# Patient Record
Sex: Male | Born: 1995 | Race: Black or African American | Hispanic: No | Marital: Single | Smoking: Never smoker
Health system: Southern US, Community
[De-identification: ages and names within clinical notes are randomized; demographics above are authoritative.]

---

## 2015-08-02 ENCOUNTER — Emergency Department (HOSPITAL_COMMUNITY)
Admission: EM | Admit: 2015-08-02 | Discharge: 2015-08-02 | Disposition: A | Discharge status: Home / Self Care | Payer: Medicaid Other | Attending: Emergency Medicine | Admitting: Emergency Medicine

## 2015-08-02 ENCOUNTER — Encounter (HOSPITAL_COMMUNITY): Payer: Self-pay | Admitting: Family Medicine

## 2015-08-02 DIAGNOSIS — R1084 Generalized abdominal pain: Secondary | ICD-10-CM | POA: Diagnosis not present

## 2015-08-02 DIAGNOSIS — R51 Headache: Secondary | ICD-10-CM | POA: Diagnosis present

## 2015-08-02 DIAGNOSIS — R42 Dizziness and giddiness: Secondary | ICD-10-CM | POA: Insufficient documentation

## 2015-08-02 DIAGNOSIS — R6889 Other general symptoms and signs: Secondary | ICD-10-CM

## 2015-08-02 DIAGNOSIS — M791 Myalgia: Secondary | ICD-10-CM | POA: Diagnosis not present

## 2015-08-02 DIAGNOSIS — R509 Fever, unspecified: Secondary | ICD-10-CM | POA: Insufficient documentation

## 2015-08-02 DIAGNOSIS — R61 Generalized hyperhidrosis: Secondary | ICD-10-CM | POA: Insufficient documentation

## 2015-08-02 LAB — COMPREHENSIVE METABOLIC PANEL
ALT: 19 U/L (ref 17–63)
AST: 22 U/L (ref 15–41)
Albumin: 4.5 g/dL (ref 3.5–5.0)
Alkaline Phosphatase: 88 U/L (ref 38–126)
Anion gap: 8 (ref 5–15)
BILIRUBIN TOTAL: 0.7 mg/dL (ref 0.3–1.2)
BUN: 10 mg/dL (ref 6–20)
CHLORIDE: 100 mmol/L — AB (ref 101–111)
CO2: 28 mmol/L (ref 22–32)
CREATININE: 1.17 mg/dL (ref 0.61–1.24)
Calcium: 9.5 mg/dL (ref 8.9–10.3)
GFR calc Af Amer: >60 mL/min (ref >60)
Glucose, Bld: 129 mg/dL — ABNORMAL HIGH (ref 65–99)
Potassium: 3.7 mmol/L (ref 3.5–5.1)
Sodium: 136 mmol/L (ref 135–145)
TOTAL PROTEIN: 8 g/dL (ref 6.5–8.1)

## 2015-08-02 LAB — CBC
HCT: 41.7 % (ref 39.0–52.0)
Hemoglobin: 14.6 g/dL (ref 13.0–17.0)
MCH: 29.3 pg (ref 26.0–34.0)
MCHC: 35 g/dL (ref 30.0–36.0)
MCV: 83.6 fL (ref 78.0–100.0)
PLATELETS: 243 10*3/uL (ref 150–400)
RBC: 4.99 MIL/uL (ref 4.22–5.81)
RDW: 12.4 % (ref 11.5–15.5)
WBC: 12.4 10*3/uL — AB (ref 4.0–10.5)

## 2015-08-02 LAB — URINE MICROSCOPIC-ADD ON
Bacteria, UA: NONE SEEN
Squamous Epithelial / LPF: NONE SEEN

## 2015-08-02 LAB — URINALYSIS, ROUTINE W REFLEX MICROSCOPIC
Bilirubin Urine: NEGATIVE
GLUCOSE, UA: NEGATIVE mg/dL
Hgb urine dipstick: NEGATIVE
KETONES UR: 15 mg/dL — AB
LEUKOCYTES UA: NEGATIVE
Nitrite: NEGATIVE
PROTEIN: 100 mg/dL — AB
Specific Gravity, Urine: 1.03 (ref 1.005–1.030)
pH: 8 (ref 5.0–8.0)

## 2015-08-02 LAB — LIPASE, BLOOD: Lipase: 20 U/L (ref 11–51)

## 2015-08-02 MED ORDER — IBUPROFEN 200 MG PO TABS
600.0000 mg | ORAL_TABLET | Freq: Once | ORAL | Status: AC
  Administered 2015-08-02: 600 mg via ORAL
  Filled 2015-08-02: qty 3

## 2015-08-02 NOTE — ED Provider Notes (Signed)
CSN: [id number]     Arrival date & time 08/02/15  0056 History   First MD Initiated Contact with Patient 08/02/15 0159     Chief Complaint  Patient presents with  . Abdominal Pain  . 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  Acute onset of myalgias, fever, headache, sore throat, runny nose, abdominal discomfort.  He did vomit one time after taking some Alka-Seltzer flu, since that time he's had no further episodes of vomiting since he has intermittent abdominal cramping.  He has not taken anything for his symptoms as he does not have anything in his house  Patient is a 19 y.o. male presenting with abdominal pain and headaches. The history is provided by the patient.  Abdominal Pain Pain location:  Generalized Pain quality: dull   Pain severity:  Mild Onset quality:  Sudden Timing:  Intermittent Chronicity:  New Relieved by:  Nothing Worsened by:  Nothing tried Associated symptoms: fever   Associated symptoms: no constipation, no cough, no diarrhea and no dysuria   Headache Pain location:  Generalized Severity currently:  7/10 Severity at highest:  4/10 Onset quality:  Sudden Timing:  Constant Progression:  Unchanged Chronicity:  New Similar to prior headaches: no   Relieved by:  None tried Worsened by:  Nothing Associated symptoms: abdominal pain, dizziness, fever and myalgias   Associated symptoms: no cough and no diarrhea     History reviewed. No pertinent past medical history. History reviewed. No pertinent past surgical history. History reviewed. No pertinent family history. Social History  Substance Use Topics  . Smoking status: Never Smoker   . Smokeless tobacco: None  . Alcohol Use: No    Review of Systems  Constitutional: Positive for fever.  Respiratory:  Negative for cough.   Gastrointestinal: Positive for abdominal pain. Negative for diarrhea and constipation.  Genitourinary: Negative for dysuria and flank pain.  Musculoskeletal: Positive for myalgias.  Neurological: Positive for dizziness and headaches.  All other systems reviewed and are negative.     Allergies  Review of patient's allergies indicates not on file.  Home Medications   Prior to Admission medications   Not on File   BP 121/72 mmHg  Pulse 100  Temp(Src) 98.7 F (37.1 C) (Oral)  Resp 20  Ht  (1.727 m)  Wt 61.236 kg  BMI 20.53 kg/m2  SpO2 98% Physical Exam  Constitutional: He is oriented to person, place, and time. He appears well-developed and well-nourished.  HENT:  Head: Normocephalic.  Mouth/Throat: Oropharynx is clear and moist.  Eyes: Pupils are equal, round, and reactive to light.  Neck: Normal range of motion.  Cardiovascular: Normal rate and regular rhythm.   Pulmonary/Chest: Effort normal and breath sounds normal.  Abdominal: Soft. He exhibits no distension. There is no tenderness.  Neurological: He is alert and oriented to person, place, and time.  Skin: Skin is warm. He is diaphoretic.  Nursing note and vitals reviewed.   ED Course  Procedures (including critical care time) Labs Review Labs Reviewed  COMPREHENSIVE METABOLIC PANEL - Abnormal; Notable for the following:    Chloride 100 (*)    Glucose, Bld 129 (*)    All other components within normal limits  CBC - Abnormal; Notable for the following:    WBC 12.4 (*)    All other components within normal limits  URINALYSIS, ROUTINE W REFLEX MICROSCOPIC (  NOT AT [facility]) - Abnormal; Notable for the following:    Color, Urine AMBER (*)    Ketones, ur 15 (*)    Protein, ur 100 (*)    All other components within normal limits  LIPASE, BLOOD  URINE MICROSCOPIC-ADD ON    Imaging Review No results found. I have personally reviewed and evaluated these images and lab results as part of my  medical decision-making.   EKG Interpretation None     increases.  Patient has influenzae.  Acute onset of all of the symptoms, approximately 18 hours.  Has not taken any medication for symptom control.  He has not tachycardic or hypotensive.  He is diaphoretic to the touch.  He will be given 800 mg ibuprofen, and influenza instructions  MDM   Final diagnoses:  Flu-like symptoms         Earley FavorGail Ataya Murdy, NP 08/02/15 [id number]  Dione Boozeavid Glick, MD 08/02/15 [id number]

## 2015-08-02 NOTE — Discharge Instructions (Signed)
Take alternating doses of tylenol/motrin for symptom relief for the next several days  Get plenty of rest  Drink plenty of fluids

## 2015-08-02 NOTE — ED Notes (Signed)
Pt reports he is experiencing is intermittent upper abd pain with nausea and vomiting. Also, complains of a diffuse headache.

## 2016-09-06 ENCOUNTER — Encounter (HOSPITAL_COMMUNITY): Payer: Self-pay | Admitting: *Deleted

## 2016-09-06 DIAGNOSIS — B009 Herpesviral infection, unspecified: Secondary | ICD-10-CM | POA: Insufficient documentation

## 2016-09-06 DIAGNOSIS — M546 Pain in thoracic spine: Secondary | ICD-10-CM | POA: Insufficient documentation

## 2016-09-06 DIAGNOSIS — Z79899 Other long term (current) drug therapy: Secondary | ICD-10-CM | POA: Insufficient documentation

## 2016-09-06 NOTE — ED Triage Notes (Signed)
The pt is c/o thinking he has herpes for 2 years  He has been tested for herpes x 2 this year.  He thinks he has an out break now.  He also wants to be seen for lower back pain

## 2016-09-07 ENCOUNTER — Emergency Department (HOSPITAL_COMMUNITY)
Admission: EM | Admit: 2016-09-07 | Discharge: 2016-09-07 | Disposition: A | Discharge status: Home / Self Care | Payer: Medicaid Other | Attending: Emergency Medicine | Admitting: Emergency Medicine

## 2016-09-07 DIAGNOSIS — A609 Anogenital herpesviral infection, unspecified: Secondary | ICD-10-CM

## 2016-09-07 DIAGNOSIS — M546 Pain in thoracic spine: Secondary | ICD-10-CM

## 2016-09-07 MED ORDER — CYCLOBENZAPRINE HCL 10 MG PO TABS: 10.0000 mg | ORAL_TABLET | Freq: Two times a day (BID) | ORAL | 0 refills | Status: AC | PRN

## 2016-09-07 MED ORDER — VALACYCLOVIR HCL 1 G PO TABS: 1000.0000 mg | ORAL_TABLET | Freq: Two times a day (BID) | ORAL | 0 refills | Status: AC

## 2016-09-07 MED ORDER — IBUPROFEN 800 MG PO TABS: 800.0000 mg | ORAL_TABLET | Freq: Three times a day (TID) | ORAL | 0 refills | Status: AC

## 2016-09-07 NOTE — ED Provider Notes (Signed)
[facility] Provider Note   CSN: [id number] Arrival date & time: 09/06/16  2233     History   Chief Complaint Chief Complaint  Patient presents with  . Back Pain  . Herpes Zoster    HPI George Manning is a 21 y.o. male with history of known genital herpes exposure who has had flareups over the past year presents with vesicles to his penis that have been present for the past 2-3 days. Patient also reports a two-week history of intermittent right-sided thoracic back pain. His pain is worse with movement and lifting at work. He denies any specific known injury, but does lift heavy objects at work. Patient has tried Tylenol and ibuprofen intermittently without relief. Patient denies any fevers, weight loss, history of cancer or recent surgery, history of IVDU, saddle anesthesia, bowel/bladder incontinence, urinary symptoms, penile pain or discharge, testicle pain or swelling, chest pain, shortness of breath, abdominal pain, nausea, vomiting.  HPI  History reviewed. No pertinent past medical history.  There are no active problems to display for this patient.   History reviewed. No pertinent surgical history.     Home Medications    Prior to Admission medications   Medication Sig Start Date End Date Taking? Authorizing Provider  cyclobenzaprine (FLEXERIL) 10 MG tablet Take 1 tablet (10 mg total) by mouth 2 (two) times daily as needed for muscle spasms. 09/07/16   Emi HolesAlexandra M Madyson Lukach, PA-C  ibuprofen (ADVIL,MOTRIN) 800 MG tablet Take 1 tablet (800 mg total) by mouth 3 (three) times daily. 09/07/16   Emi HolesAlexandra M Delonda Coley, PA-C  valACYclovir (VALTREX) 1000 MG tablet Take 1 tablet (1,000 mg total) by mouth 2 (two) times daily. 09/07/16   Emi HolesAlexandra M Rahel Carlton, PA-C    Family History No family history on file.  Social History Social History  Substance Use Topics  . Smoking status: Never Smoker  . Smokeless tobacco: Never Used  . Alcohol use No     Allergies   Patient has no known  allergies.   Review of Systems Review of Systems  Constitutional: Negative for chills and fever.  HENT: Negative for facial swelling and sore throat.   Respiratory: Negative for shortness of breath.   Cardiovascular: Negative for chest pain.  Gastrointestinal: Negative for abdominal pain, nausea and vomiting.  Genitourinary: Positive for genital sores. Negative for discharge, dysuria, frequency, penile pain, penile swelling, scrotal swelling and testicular pain.  Musculoskeletal: Positive for back pain. Negative for neck pain.  Skin: Negative for rash and wound.  Neurological: Negative for headaches.  Psychiatric/Behavioral: The patient is not nervous/anxious.      Physical Exam Updated Vital Signs BP 130/91 (BP Location: Left Arm)   Pulse 91   Temp 98.8 F (37.1 C) (Oral)   Resp 18   Ht 5\' 7"  (1.702 m)   Wt 59 kg   SpO2 100%   BMI 20.38 kg/m   Physical Exam  Constitutional: He appears well-developed and well-nourished. No distress.  HENT:  Head: Normocephalic and atraumatic.  Mouth/Throat: Oropharynx is clear and moist. No oropharyngeal exudate.  Eyes: Conjunctivae are normal. Pupils are equal, round, and reactive to light. Right eye exhibits no discharge. Left eye exhibits no discharge. No scleral icterus.  Neck: Normal range of motion. Neck supple. No thyromegaly present.  Cardiovascular: Normal rate, regular rhythm, normal heart sounds and intact distal pulses.  Exam reveals no gallop and no friction rub.   No murmur heard. Pulmonary/Chest: Effort normal and breath sounds normal. No stridor. No respiratory distress. He  has no wheezes. He has no rales.  Abdominal: Soft. Bowel sounds are normal. He exhibits no distension. There is no tenderness. There is no rebound and no guarding.  Genitourinary: Testes normal. Circumcised. No penile erythema or penile tenderness. No discharge found.  Genitourinary Comments: 2 mm vesicle to shaft of penis, nontender no drainage or other  signs of infection  Musculoskeletal: He exhibits no edema.       Thoracic back: He exhibits tenderness. He exhibits no bony tenderness.       Back:  Right thoracic paraspinal tenderness, no midline tenderness of cervical, thoracic, lumbar spine  Lymphadenopathy:    He has no cervical adenopathy.  Neurological: He is alert. Coordination normal.  Normal sensation, 5/5 strength to all 4 extremities, 2+ patellar reflexes  Skin: Skin is warm and dry. No rash noted. He is not diaphoretic. No pallor.  Psychiatric: He has a normal mood and affect.  Nursing note and vitals reviewed.    ED Treatments / Results  Labs (all labs ordered are listed, but only abnormal results are displayed) Labs Reviewed - No data to display  EKG  EKG Interpretation None       Radiology No results found.  Procedures Procedures (including critical care time)  Medications Ordered in ED Medications - No data to display   Initial Impression / Assessment and Plan / ED Course  I have reviewed the triage vital signs and the nursing notes.  Pertinent labs & imaging results that were available during my care of the patient were reviewed by me and considered in my medical decision making (see chart for details).  Clinical Course     Patient with back pain.  No neurological deficits and normal neuro exam.  Patient is ambulatory.  No loss of bowel or bladder control.  No concern for cauda equina.  No fever, night sweats, weight loss, h/o cancer, IVDA, no recent procedure to back. No urinary symptoms suggestive of UTI.  Supportive care and return precaution discussed.   Patient also with probable HSV. Will treat with Valtrex. Patient to follow-up with the health department for further treatment. Patient denies any other known exposure to STD and declines treatment or other STD testing today. Return precautions discussed. Patient understands and agrees with plan. Patient vitals stable throughout ED course and  discharged in satisfactory condition.   Final Clinical Impressions(s) / ED Diagnoses   Final diagnoses:  HSV (herpes simplex virus) anogenital infection  Acute right-sided thoracic back pain    New Prescriptions Discharge Medication List as of 09/07/2016  1:38 AM    START taking these medications   Details  cyclobenzaprine (FLEXERIL) 10 MG tablet Take 1 tablet (10 mg total) by mouth 2 (two) times daily as needed for muscle spasms., Starting Sat 09/07/2016, Print    ibuprofen (ADVIL,MOTRIN) 800 MG tablet Take 1 tablet (800 mg total) by mouth 3 (three) times daily., Starting Sat 09/07/2016, Print    valACYclovir (VALTREX) 1000 MG tablet Take 1 tablet (1,000 mg total) by mouth 2 (two) times daily., Starting Sat 09/07/2016, Print         Emi Holes, PA-C 09/07/16 0201    Jerelyn Scott, MD 09/07/16 [phone]

## 2016-09-07 NOTE — ED Notes (Signed)
ED Provider at bedside. 

## 2016-09-07 NOTE — Discharge Instructions (Signed)
Medications: Valacyclovir, Flexeril, ibuprofen  Treatment: Take Valacyclovir twice daily for 1 week. Take Flexeril twice daily as needed for muscle pain and spasms. Do not drive or operate machinery when taking Flexeril. Take ibuprofen every 8 hours for 1 week. Use ice and heat alternating 20 minutes on, 20 minutes off. Attempted the stretches 1-2 times daily as tolerated.  Follow-up: Please follow-up at the health department for further treatment of your most likely herpes infection and further STD checks. STD checks are free at the health department. Please return to emergency department if you develop any new or worsening symptoms as we discussed.

## 2017-12-19 ENCOUNTER — Ambulatory Visit (HOSPITAL_COMMUNITY)
Admission: EM | Admit: 2017-12-19 | Discharge: 2017-12-19 | Disposition: A | Discharge status: Home / Self Care | Payer: BLUE CROSS/BLUE SHIELD | Attending: Internal Medicine | Admitting: Internal Medicine

## 2017-12-19 ENCOUNTER — Encounter (HOSPITAL_COMMUNITY): Payer: Self-pay | Admitting: Emergency Medicine

## 2017-12-19 DIAGNOSIS — J019 Acute sinusitis, unspecified: Secondary | ICD-10-CM | POA: Diagnosis not present

## 2017-12-19 MED ORDER — TRIAMCINOLONE ACETONIDE 55 MCG/ACT NA AERO: 2.0000 | INHALATION_SPRAY | Freq: Every day | NASAL | 0 refills | Status: AC

## 2017-12-19 MED ORDER — AMOXICILLIN-POT CLAVULANATE 875-125 MG PO TABS: 1.0000 | ORAL_TABLET | Freq: Two times a day (BID) | ORAL | 0 refills | Status: AC

## 2017-12-19 NOTE — ED Triage Notes (Signed)
Pt states "ive been really congestion, frequent headaches, my body feels kinda weak, I have a lot of mucous in my system, and yesterday i've been coughing up a lot of mucous".

## 2017-12-19 NOTE — Discharge Instructions (Addendum)
No danger signs on exam today.  Anticipate gradual improvement in cough, head congestion, headache, over the next several days.  Cough may take a couple weeks to subside.  Prescriptions for amoxicillin/clavulanate (antibiotic) and triamcinolone nasal steroid spray (for congestion) were sent to the pharmacy.  Push fluids and rest.  Note for work today/tomorrow.  Recheck for new fever >100.5, increasing phlegm production/nasal discharge, or if not starting to improve in a few days.

## 2017-12-19 NOTE — ED Provider Notes (Signed)
[facility]    CSN: [id number] Arrival date & time: 12/19/17  1543     History   Chief Complaint Chief Complaint  Patient presents with  . URI    HPI George Manning is a 22 y.o. male.   He presents today with a couple weeks history of head congestion, runny nose, headaches.  The last 24 hours, he has had increased headache, chills, and now some cough productive of phlegm.  Fatigue, malaise.  Poor appetite, but no nausea or vomiting.  No diarrhea.  Little bit achy.  Had some sore throat initially, now resolved.    HPI  History reviewed. No pertinent past medical history.  History reviewed. No pertinent surgical history.     Home Medications    Prior to Admission medications   Medication Sig Start Date End Date Taking? Authorizing Provider  amoxicillin-clavulanate (AUGMENTIN) 875-125 MG tablet Take 1 tablet by mouth 2 (two) times daily for 7 days. 12/19/17 12/26/17  Isa RankinMurray, Tarquin Welcher Wilson, MD  cyclobenzaprine (FLEXERIL) 10 MG tablet Take 1 tablet (10 mg total) by mouth 2 (two) times daily as needed for muscle spasms. 09/07/16   Law, Waylan BogaAlexandra M, PA-C  ibuprofen (ADVIL,MOTRIN) 800 MG tablet Take 1 tablet (800 mg total) by mouth 3 (three) times daily. 09/07/16   Law, Waylan BogaAlexandra M, PA-C  triamcinolone (NASACORT) 55 MCG/ACT AERO nasal inhaler Place 2 sprays into the nose daily. 12/19/17   Isa RankinMurray, Kellin Fifer Wilson, MD  valACYclovir (VALTREX) 1000 MG tablet Take 1 tablet (1,000 mg total) by mouth 2 (two) times daily. 09/07/16   Emi HolesLaw, Alexandra M, PA-C    Family History Family History  Problem Relation Age of Onset  . Hypertension Father     Social History Social History   Tobacco Use  . Smoking status: Never Smoker  . Smokeless tobacco: Never Used  Substance Use Topics  . Alcohol use: No  . Drug use: No     Allergies   Patient has no known allergies.   Review of Systems Review of Systems  All other systems reviewed and are negative.    Physical Exam Triage  Vital Signs ED Triage Vitals  Enc Vitals Group     BP 12/19/17 1652 110/65     Pulse Rate 12/19/17 1652 82     Resp 12/19/17 1652 18     Temp 12/19/17 1652 98.1 F (36.7 C)     Temp src --      SpO2 12/19/17 1652 100 %     Weight --      Height --      Pain Score 12/19/17 1756 0     Pain Loc --    Updated Vital Signs BP 110/65   Pulse 82   Temp 98.1 F (36.7 C)   Resp 18   SpO2 100%   Physical Exam  Constitutional: He is oriented to person, place, and time. No distress.  Alert, nicely groomed Looks ill but not toxic  HENT:  Head: Atraumatic.  Bilateral TMs are moderately dull, no erythema Moderate nasal congestion bilaterally with copious mucopurulent material present Throat is somewhat red  Eyes:  Conjugate gaze, no eye redness/drainage  Neck: Neck supple.  Cardiovascular: Normal rate and regular rhythm.  Pulmonary/Chest: No respiratory distress. He has no wheezes. He has no rales.  Lungs clear, symmetric breath sounds  Abdominal: He exhibits no distension.  Musculoskeletal: Normal range of motion.  Neurological: He is alert and oriented to person, place, and time.  Skin: Skin is  warm and dry.  No cyanosis  Nursing note and vitals reviewed.    UC Treatments / Results  Labs (all labs ordered are listed, but only abnormal results are displayed) Labs Reviewed - No data to display  EKG None Radiology No results found.  Procedures Procedures (including critical care time) None today  Final Clinical Impressions(s) / UC Diagnoses   Final diagnoses:  Acute sinusitis with symptoms > 10 days   No danger signs on exam today.  Anticipate gradual improvement in cough, head congestion, headache, over the next several days.  Cough may take a couple weeks to subside.  Prescriptions for amoxicillin/clavulanate (antibiotic) and triamcinolone nasal steroid spray (for congestion) were sent to the pharmacy.  Push fluids and rest.  Note for work today/tomorrow.  Recheck  for new fever >100.5, increasing phlegm production/nasal discharge, or if not starting to improve in a few days.     ED Discharge Orders        Ordered    amoxicillin-clavulanate (AUGMENTIN) 875-125 MG tablet  2 times daily     12/19/17 1752    triamcinolone (NASACORT) 55 MCG/ACT AERO nasal inhaler  Daily     12/19/17 1752        Isa Rankin, MD 12/22/17 2105

## 2019-06-24 ENCOUNTER — Encounter (HOSPITAL_COMMUNITY): Payer: Self-pay | Admitting: Emergency Medicine

## 2019-06-24 ENCOUNTER — Emergency Department (HOSPITAL_COMMUNITY): Payer: 59

## 2019-06-24 ENCOUNTER — Emergency Department (HOSPITAL_COMMUNITY)
Admission: EM | Admit: 2019-06-24 | Discharge: 2019-06-24 | Disposition: A | Discharge status: Home / Self Care | Payer: 59 | Attending: Emergency Medicine | Admitting: Emergency Medicine

## 2019-06-24 ENCOUNTER — Other Ambulatory Visit: Payer: Self-pay

## 2019-06-24 DIAGNOSIS — Y929 Unspecified place or not applicable: Secondary | ICD-10-CM | POA: Insufficient documentation

## 2019-06-24 DIAGNOSIS — S93601A Unspecified sprain of right foot, initial encounter: Secondary | ICD-10-CM

## 2019-06-24 DIAGNOSIS — S99921A Unspecified injury of right foot, initial encounter: Secondary | ICD-10-CM | POA: Diagnosis present

## 2019-06-24 DIAGNOSIS — Y9367 Activity, basketball: Secondary | ICD-10-CM | POA: Diagnosis not present

## 2019-06-24 DIAGNOSIS — Z79899 Other long term (current) drug therapy: Secondary | ICD-10-CM | POA: Insufficient documentation

## 2019-06-24 DIAGNOSIS — X509XXA Other and unspecified overexertion or strenuous movements or postures, initial encounter: Secondary | ICD-10-CM | POA: Diagnosis not present

## 2019-06-24 DIAGNOSIS — Y999 Unspecified external cause status: Secondary | ICD-10-CM | POA: Insufficient documentation

## 2019-06-24 NOTE — ED Triage Notes (Signed)
Pt was playing basketball on Tuesday and when landed rolled his ankle. Pt having right ankle pains since. Has brace currently on ankle at this time.

## 2019-06-24 NOTE — ED Provider Notes (Signed)
[facility]-EMERGENCY DEPT Provider Note   CSN: [id number] Arrival date & time: 06/24/19  1145     History   Chief Complaint Chief Complaint  Patient presents with  . Ankle Pain    HPI George Manning is a 23 y.o. male.     22yo male presents with right ankle/foot pain. Patient states he rolled his ankle while playing basketball Tuesday night. Patient has been wearing an ankle support without relief. Pain is worse with bearing weight. No significant ankle injuries previously. No other injuries or concerns.      History reviewed. No pertinent past medical history.  There are no active problems to display for this patient.   History reviewed. No pertinent surgical history.      Home Medications    Prior to Admission medications   Medication Sig Start Date End Date Taking? Authorizing Provider  cyclobenzaprine (FLEXERIL) 10 MG tablet Take 1 tablet (10 mg total) by mouth 2 (two) times daily as needed for muscle spasms. 09/07/16   Law, Waylan Boga, PA-C  ibuprofen (ADVIL,MOTRIN) 800 MG tablet Take 1 tablet (800 mg total) by mouth 3 (three) times daily. 09/07/16   Law, Waylan Boga, PA-C  triamcinolone (NASACORT) 55 MCG/ACT AERO nasal inhaler Place 2 sprays into the nose daily. 12/19/17   George Rankin, MD  valACYclovir (VALTREX) 1000 MG tablet Take 1 tablet (1,000 mg total) by mouth 2 (two) times daily. 09/07/16   George Holes, PA-C    Family History Family History  Problem Relation Age of Onset  . Hypertension Father     Social History Social History   Tobacco Use  . Smoking status: Never Smoker  . Smokeless tobacco: Never Used  Substance Use Topics  . Alcohol use: No  . Drug use: No     Allergies   Patient has no known allergies.   Review of Systems Review of Systems  Constitutional: Negative for fever.  Musculoskeletal: Positive for arthralgias, gait problem and myalgias. Negative for joint swelling.  Skin: Negative for color  change, rash and wound.  Allergic/Immunologic: Negative for immunocompromised state.  Neurological: Negative for weakness and numbness.  Hematological: Does not bruise/bleed easily.  Psychiatric/Behavioral: Negative for confusion.  All other systems reviewed and are negative.    Physical Exam Updated Vital Signs BP 135/72 (BP Location: Right Arm)   Pulse 70   Temp 98.4 F (36.9 C) (Oral)   Resp 17   Ht 5\' 8"  (1.727 m)   Wt 62.6 kg   SpO2 100%   BMI 20.98 kg/m   Physical Exam Vitals signs and nursing note reviewed.  Constitutional:      General: He is not in acute distress.    Appearance: He is well-developed. He is not diaphoretic.  HENT:     Head: Normocephalic and atraumatic.  Cardiovascular:     Pulses: Normal pulses.  Pulmonary:     Effort: Pulmonary effort is normal.  Musculoskeletal: Normal range of motion.        General: Tenderness present. No swelling, deformity or signs of injury.     Right ankle: He exhibits normal range of motion, no swelling, no ecchymosis, no deformity, no laceration and normal pulse. No lateral malleolus, no medial malleolus, no head of 5th metatarsal and no proximal fibula tenderness found.     Right foot: Normal range of motion. Bony tenderness present. No crepitus or deformity.       Feet:  Skin:    General: Skin is warm  and dry.     Findings: No erythema or rash.  Neurological:     Mental Status: He is alert and oriented to person, place, and time.  Psychiatric:        Behavior: Behavior normal.      ED Treatments / Results  Labs (all labs ordered are listed, but only abnormal results are displayed) Labs Reviewed - No data to display  EKG None  Radiology Dg Ankle Complete Right  Result Date: 06/24/2019 CLINICAL DATA:  Right ankle pain after basketball injury. EXAM: RIGHT ANKLE - COMPLETE 3+ VIEW COMPARISON:  None. FINDINGS: There is no evidence of fracture, dislocation, or joint effusion. There is no evidence of  arthropathy or other focal bone abnormality. Soft tissues are unremarkable. IMPRESSION: Negative. Electronically Signed   By: George Manning M.D.   On: 06/24/2019 13:08   Dg Foot Complete Right  Result Date: 06/24/2019 CLINICAL DATA:  Pain following rolling injury EXAM: RIGHT FOOT COMPLETE - 3+ VIEW COMPARISON:  None. FINDINGS: Frontal, oblique, and lateral views obtained. No fracture or dislocation. Joint spaces appear normal. There is pes planus. There is an accessory ossicle lateral to the cuboid. IMPRESSION: No fracture or dislocation. No evident arthropathy. There is pes planus. Electronically Signed   By: George Manning III M.D.   On: 06/24/2019 14:15    Procedures Procedures (including critical care time)  Medications Ordered in ED Medications - No data to display   Initial Impression / Assessment and Plan / ED Course  I have reviewed the triage vital signs and the nursing notes.  Pertinent labs & imaging results that were available during my care of the patient were reviewed by me and considered in my medical decision making (see chart for details).  Clinical Course as of Jun 23 1426  Thu Jun 24, 9083  478 23 year old male presents with complaint of right foot pain after rolling his foot while playing basketball 2 days ago.  Patient is wearing an ankle support without relief.  On examination of pain at the proximal fourth metatarsal.  X-ray of the ankles unremarkable.  X-ray foot is unremarkable.  Patient states he is unable to bear weight without pain in his foot, given crutches to weight-bear as tolerated and follow-up with orthopedics if not improving.   [LM]    Clinical Course User Index [LM] Tacy Learn, PA-C      Final Clinical Impressions(s) / ED Diagnoses   Final diagnoses:  Sprain of right foot, initial encounter    ED Discharge Orders    None       Tacy Learn, PA-C 06/24/19 1427    Milton Ferguson, MD 06/27/19 1818

## 2019-06-24 NOTE — Discharge Instructions (Signed)
Take Motrin and Tylenol as needed as directed. Apply ice to foot, elevate for 30 minutes at a time. Follow up with orthopedics if not improving.

## 2020-10-25 ENCOUNTER — Other Ambulatory Visit: Payer: Self-pay

## 2020-10-25 ENCOUNTER — Emergency Department (HOSPITAL_COMMUNITY): Payer: 59

## 2020-10-25 ENCOUNTER — Emergency Department (HOSPITAL_COMMUNITY)
Admission: EM | Admit: 2020-10-25 | Discharge: 2020-10-25 | Disposition: A | Discharge status: Home / Self Care | Payer: 59 | Attending: Emergency Medicine | Admitting: Emergency Medicine

## 2020-10-25 ENCOUNTER — Encounter (HOSPITAL_COMMUNITY): Payer: Self-pay | Admitting: Emergency Medicine

## 2020-10-25 DIAGNOSIS — N50812 Left testicular pain: Secondary | ICD-10-CM | POA: Insufficient documentation

## 2020-10-25 DIAGNOSIS — N5089 Other specified disorders of the male genital organs: Secondary | ICD-10-CM | POA: Insufficient documentation

## 2020-10-25 DIAGNOSIS — R350 Frequency of micturition: Secondary | ICD-10-CM | POA: Insufficient documentation

## 2020-10-25 DIAGNOSIS — R1031 Right lower quadrant pain: Secondary | ICD-10-CM | POA: Diagnosis not present

## 2020-10-25 DIAGNOSIS — R369 Urethral discharge, unspecified: Secondary | ICD-10-CM | POA: Diagnosis not present

## 2020-10-25 LAB — URINALYSIS, ROUTINE W REFLEX MICROSCOPIC
Bilirubin Urine: NEGATIVE
Glucose, UA: NEGATIVE mg/dL
Hgb urine dipstick: NEGATIVE
Ketones, ur: NEGATIVE mg/dL
Leukocytes,Ua: NEGATIVE
Nitrite: NEGATIVE
Protein, ur: NEGATIVE mg/dL
Specific Gravity, Urine: 1.025 (ref 1.005–1.030)
pH: 7 (ref 5.0–8.0)

## 2020-10-25 MED ORDER — LIDOCAINE HCL (PF) 1 % IJ SOLN
INTRAMUSCULAR | Status: AC
  Administered 2020-10-25: 30 mL
  Filled 2020-10-25: qty 30

## 2020-10-25 MED ORDER — CEFTRIAXONE SODIUM 250 MG IJ SOLR
250.0000 mg | Freq: Once | INTRAMUSCULAR | Status: DC
  Filled 2020-10-25: qty 250

## 2020-10-25 MED ORDER — CEFTRIAXONE SODIUM 1 G IJ SOLR
500.0000 mg | Freq: Once | INTRAMUSCULAR | Status: AC
  Administered 2020-10-25: 500 mg via INTRAMUSCULAR
  Filled 2020-10-25: qty 10

## 2020-10-25 MED ORDER — DOXYCYCLINE HYCLATE 100 MG PO CAPS: 100.0000 mg | ORAL_CAPSULE | Freq: Two times a day (BID) | ORAL | 0 refills | Status: AC

## 2020-10-25 NOTE — ED Triage Notes (Signed)
Patient presents with complaints of frequency, abdominal pain, testicle pain and swelling. Complaints started this past Monday while the patient was at work. While at work he noticed testicle pain and swelling. He believed ill fitting underwear was the source of the discomfort and did notice some relief after adjusting them. Yesterday the patient went without underwear which continued to provide some relief. While testicle pain was decreasing slightly yesterday, the patient now noticed urinary frequency, abdominal pain and some discharge. Today the patient woke with increased testicle pain which makes walking and standing difficult and well as a continuation of urinary symptoms.

## 2020-10-25 NOTE — Discharge Instructions (Addendum)
Your urine did not show any signs of infection.  We treated you today prophylactically for gonorrhea.    We will send a prescription for antibiotics in order to treat you for chlamydia.  You will need to take 1 tablet twice a day for the next 10 days.  If you experience any worsening symptoms please return to the emergency department.

## 2020-10-25 NOTE — ED Provider Notes (Signed)
[facility]-EMERGENCY DEPT Provider Note   CSN: [id number] Arrival date & time: 10/25/20  1138     History Chief Complaint  Patient presents with   Abdominal Pain   Testicle Pain   Urinary Frequency    George Manning is a 25 y.o. male.  25 y.o male with no PMH presents to the ED with a chief complaint of testicular swelling x3 days.  Patient reports noting pain along the left testicle, then noticed this to be swollen.  Symptoms are exacerbated with ambulation along with wearing briefs.  They are alleviated with sitting, relieving pressure off his testicle. He is also concerned as left testicle appears larger than right. He is sexually active with last encounter about a month ago, with condom use. He does endorses penile discharge, describing this as a thick clear drainage. In addition, he reports lower abdominal pain diffuse without focal point of tenderness. No fever, no nausea, no vomiting, or other complaints.   The history is provided by the patient.  Abdominal Pain Pain location:  LLQ and RLQ Pain quality: fullness   Pain radiates to:  Does not radiate Associated symptoms: no diarrhea, no fever, no nausea, no shortness of breath, no sore throat and no vomiting   Testicle Pain This is a new problem. The current episode started more than 2 days ago. The problem occurs constantly. The problem has been gradually worsening. Associated symptoms include abdominal pain. Pertinent negatives include no headaches and no shortness of breath. The symptoms are aggravated by standing. The symptoms are relieved by rest. He has tried nothing for the symptoms.  Urinary Frequency Associated symptoms include abdominal pain. Pertinent negatives include no headaches and no shortness of breath.       History reviewed. No pertinent past medical history.  There are no problems to display for this patient.   History reviewed. No pertinent surgical history.     Family  History  Problem Relation Age of Onset   Hypertension Father     Social History   Tobacco Use   Smoking status: Never Smoker   Smokeless tobacco: Never Used  Substance Use Topics   Alcohol use: No   Drug use: No    Home Medications Prior to Admission medications   Medication Sig Start Date End Date Taking? Authorizing Provider  doxycycline (VIBRAMYCIN) 100 MG capsule Take 1 capsule (100 mg total) by mouth 2 (two) times daily for 10 days. 10/25/20 11/04/20 Yes Charmian Forbis, Leonie Douglas, PA-C  cyclobenzaprine (FLEXERIL) 10 MG tablet Take 1 tablet (10 mg total) by mouth 2 (two) times daily as needed for muscle spasms. 09/07/16   Law, Waylan Boga, PA-C  ibuprofen (ADVIL,MOTRIN) 800 MG tablet Take 1 tablet (800 mg total) by mouth 3 (three) times daily. 09/07/16   Law, Waylan Boga, PA-C  triamcinolone (NASACORT) 55 MCG/ACT AERO nasal inhaler Place 2 sprays into the nose daily. 12/19/17   Isa Rankin, MD  valACYclovir (VALTREX) 1000 MG tablet Take 1 tablet (1,000 mg total) by mouth 2 (two) times daily. 09/07/16   Emi Holes, PA-C    Allergies    Patient has no known allergies.  Review of Systems   Review of Systems  Constitutional: Negative for fever.  HENT: Negative for sore throat.   Respiratory: Negative for shortness of breath.   Gastrointestinal: Positive for abdominal pain. Negative for diarrhea, nausea and vomiting.  Genitourinary: Positive for frequency, penile discharge, scrotal swelling and testicular pain.  Neurological: Negative for light-headedness and headaches.  All other systems reviewed and are negative.   Physical Exam Updated Vital Signs BP 129/64    Pulse 70    Temp 98 F (36.7 C) (Oral)    Resp (!) 22    Ht 5\' 7"  (1.702 m)    Wt 63.5 kg    SpO2 98%    BMI 21.93 kg/m   Physical Exam Vitals and nursing note reviewed. Exam conducted with a chaperone present.  Constitutional:      Appearance: He is well-developed.  HENT:     Head: Normocephalic and atraumatic.   Cardiovascular:     Rate and Rhythm: Normal rate.  Pulmonary:     Effort: Pulmonary effort is normal.     Breath sounds: No wheezing or rales.  Abdominal:     General: Abdomen is flat. Bowel sounds are normal.  Genitourinary:    Penis: Discharge present.      Testes:        Right: Tenderness or swelling not present.        Left: Tenderness and swelling present.     Comments: Left testicle > right testicle , no visible erythema . No rash, no obvious discharge on my exam. Skin:    General: Skin is warm and dry.  Neurological:     Mental Status: He is alert and oriented to person, place, and time.     ED Results / Procedures / Treatments   Labs (all labs ordered are listed, but only abnormal results are displayed) Labs Reviewed  URINALYSIS, ROUTINE W REFLEX MICROSCOPIC  GC/CHLAMYDIA PROBE AMP ([place]) NOT AT [facility]    EKG None  Radiology [facility] SCROTUM W/DOPPLER  Result Date: 10/25/2020 CLINICAL DATA:  Pain EXAM: SCROTAL ULTRASOUND DOPPLER ULTRASOUND OF THE TESTICLES TECHNIQUE: Complete ultrasound examination of the testicles, epididymis, and other scrotal structures was performed. Color and spectral Doppler ultrasound were also utilized to evaluate blood flow to the testicles. COMPARISON:  None. FINDINGS: Right testicle Measurements: 5.3 x 3.7 x 2.2 cm. No mass or microlithiasis visualized. Left testicle Measurements: 5.0 x 3.7 x 2.8 cm. No mass or microlithiasis visualized. Right epididymis:  Normal in size and appearance. Left epididymis:  Normal in size and appearance. Hydrocele:  None visualized. Varicocele:  None visualized. Pulsed Doppler interrogation of both testes demonstrates normal low resistance arterial and venous waveforms bilaterally. IMPRESSION: Normal study. No evidence of testicular mass or torsion. Electronically Signed   By: 10/27/2020 MD   On: 10/25/2020 14:01    Procedures Procedures   Medications Ordered in ED Medications  cefTRIAXone (ROCEPHIN)  injection 500 mg (has no administration in time range)    ED Course  I have reviewed the triage vital signs and the nursing notes.  Pertinent labs & imaging results that were available during my care of the patient were reviewed by me and considered in my medical decision making (see chart for details).    MDM Rules/Calculators/A&P     Patient presents to the ED today with complaints of testicular swelling along with testicular pain, left greater than right.  The symptoms began 3 days ago, does report some penile discharge which has worsened throughout the day.  There is also pain to the left testicular region, he does notice this is more swollen than his right.  Exacerbated with movement alleviated with rest.  Has not taken any medications for improvement in his symptoms.  Vitals are within normal limits, he is afebrile.  He is currently sexually active, last encounter within the  last month.  During evaluation left testicle appears enlarged, there is noticeable pain with palpation of the epididymis.  Chaperoned by Doree Fudge.  No visible penile discharge on my exam.  No pain with palpation of the groin.  Abdomen is soft, nontender to palpation.  GC and chlamydia have been sent off for culture at this time, he was advised that results will not return within the next 2 days.  He was treated today with 500 mg of Rocephin IM, he will go home with a prescription for doxycycline in order prophylactically treat for chlamydia.  Vitals have remained stable, urinalysis without any signs of infection.  Patient is stable for discharge at this time.  Portions of this note were generated with Scientist, clinical (histocompatibility and immunogenetics). Dictation errors may occur despite best attempts at proofreading.  Final Clinical Impression(s) / ED Diagnoses Final diagnoses:  Pain in left testicle  Testicular swelling    Rx / DC Orders ED Discharge Orders         Ordered    doxycycline (VIBRAMYCIN) 100 MG capsule  2 times daily         10/25/20 [address] PA-C 10/25/20 1448    Pricilla Loveless, MD 10/27/20 [phone]

## 2020-10-26 LAB — GC/CHLAMYDIA PROBE AMP (~~LOC~~) NOT AT ARMC
Chlamydia: POSITIVE — AB
Comment: NEGATIVE
Comment: NORMAL
Neisseria Gonorrhea: NEGATIVE

## 2021-02-06 ENCOUNTER — Encounter: Payer: Self-pay | Admitting: Gastroenterology

## 2021-03-07 ENCOUNTER — Ambulatory Visit: Payer: PRIVATE HEALTH INSURANCE | Admitting: Gastroenterology

## 2021-03-30 ENCOUNTER — Other Ambulatory Visit: Payer: Self-pay | Admitting: Gastroenterology

## 2021-03-30 ENCOUNTER — Ambulatory Visit
Admission: RE | Admit: 2021-03-30 | Discharge: 2021-03-30 | Disposition: A | Discharge status: Home / Self Care | Payer: PRIVATE HEALTH INSURANCE | Source: Ambulatory Visit | Attending: Gastroenterology | Admitting: Gastroenterology

## 2021-03-30 DIAGNOSIS — R197 Diarrhea, unspecified: Secondary | ICD-10-CM

## 2021-03-30 DIAGNOSIS — R1030 Lower abdominal pain, unspecified: Secondary | ICD-10-CM

## 2021-10-18 IMAGING — US US SCROTUM W/ DOPPLER COMPLETE
1 series · 14 of 25 positions shown · non-contrast
Comparison: None.

CLINICAL DATA: Pain

EXAM:
SCROTAL ULTRASOUND
DOPPLER ULTRASOUND OF THE TESTICLES
TECHNIQUE: Complete ultrasound examination of the testicles, epididymis, and
other scrotal structures was performed. Color and spectral Doppler
ultrasound were also utilized to evaluate blood flow to the
testicles.

[Series 1: us scrotum w/ doppler complete · 14 of 32 slices shown]
[im 1/32]
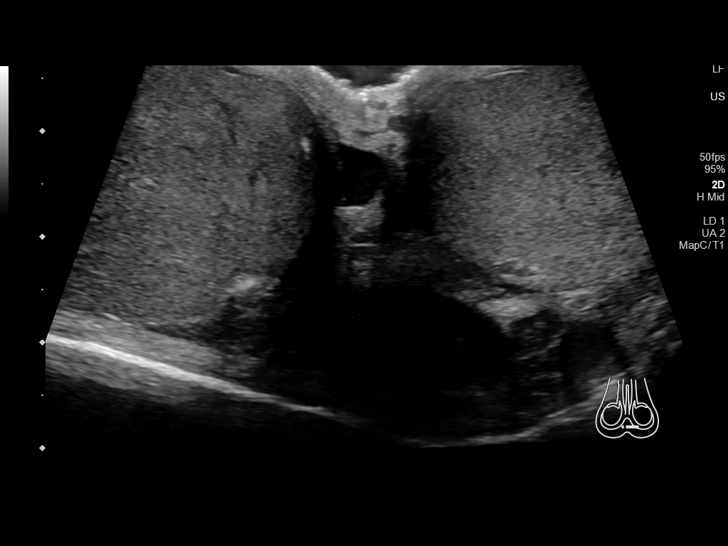
[im 3/32]
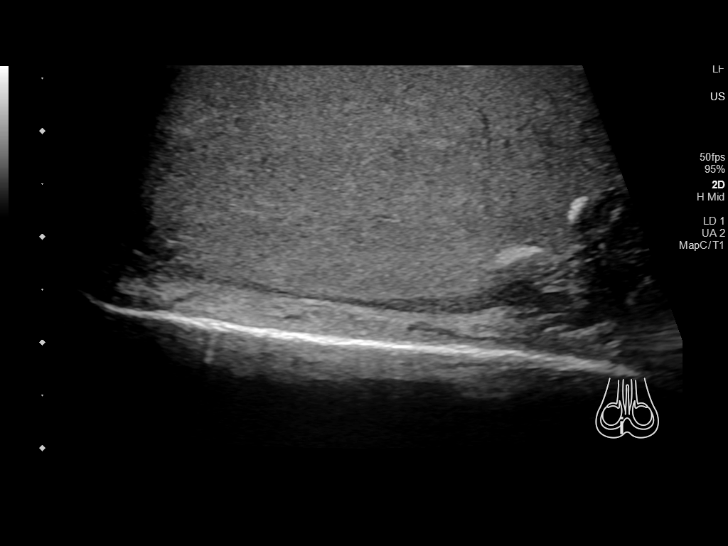
[im 6/32]
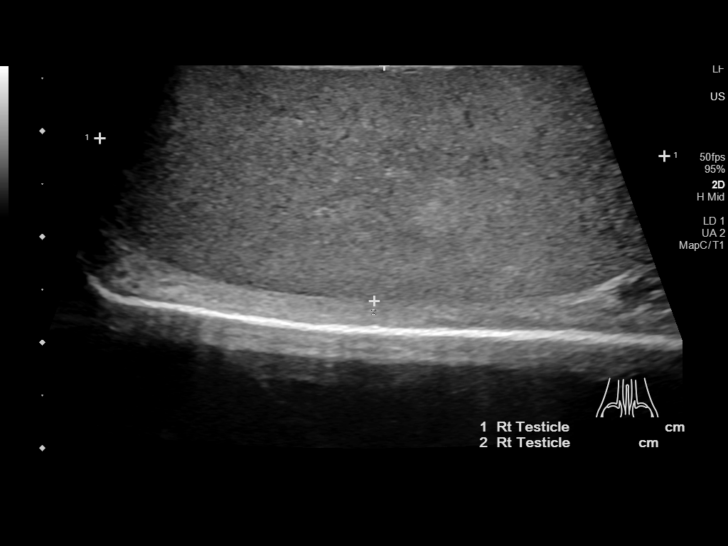
[im 8/32]
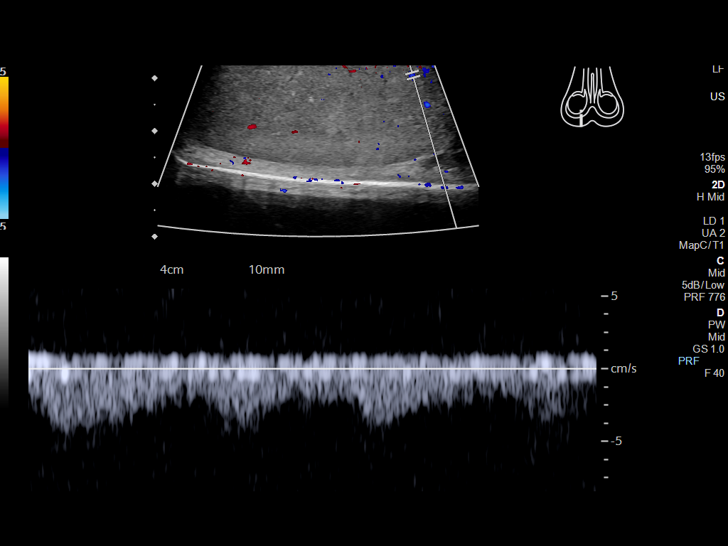
[im 11/32]
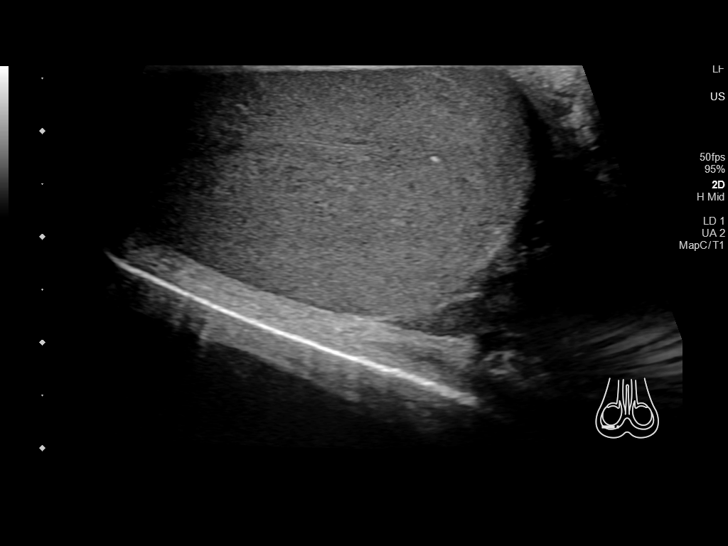
[im 12/32]
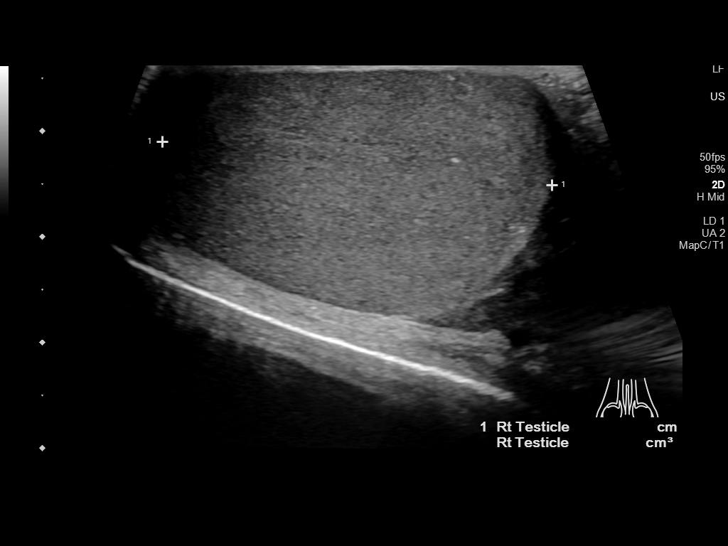
[im 15/32]
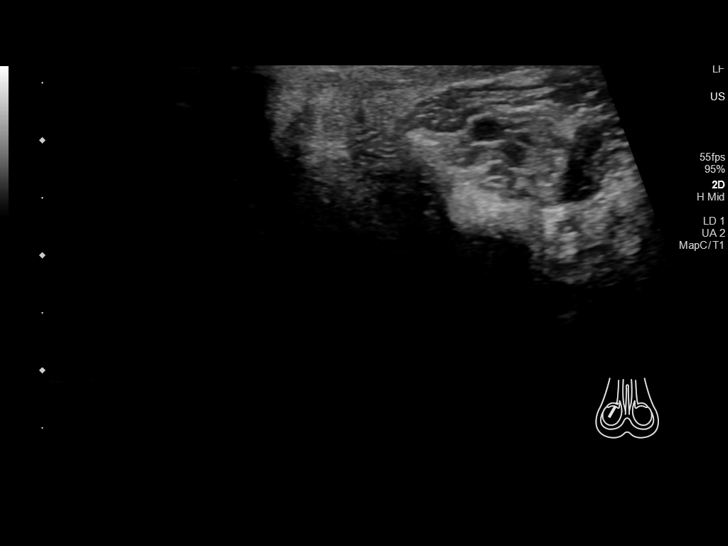
[im 17/32]
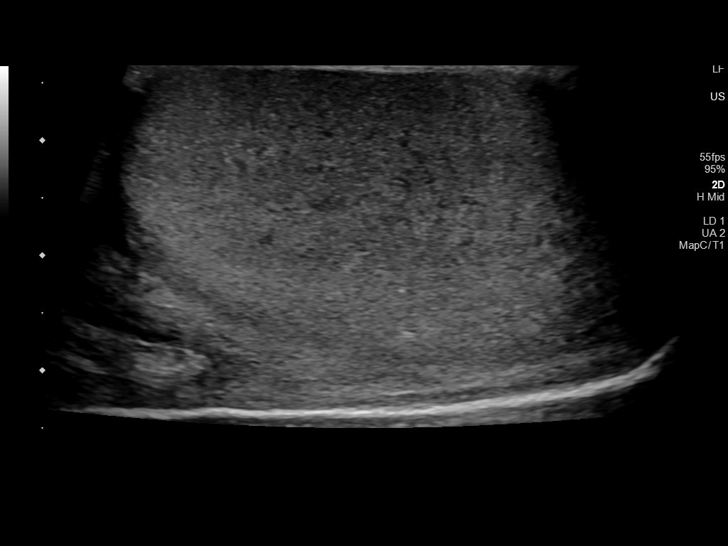
[im 20/32]
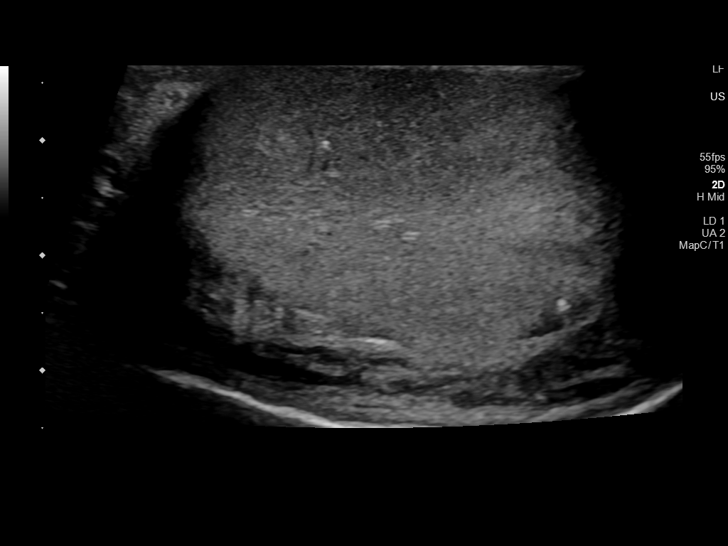
[im 21/32]
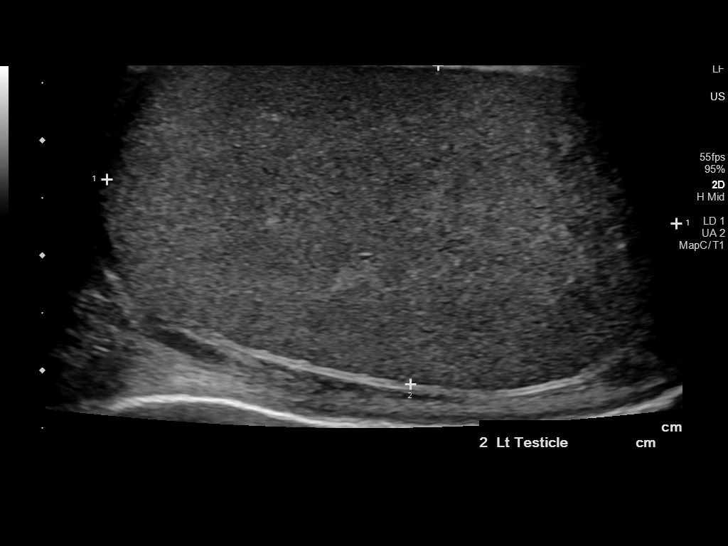
[im 24/32]
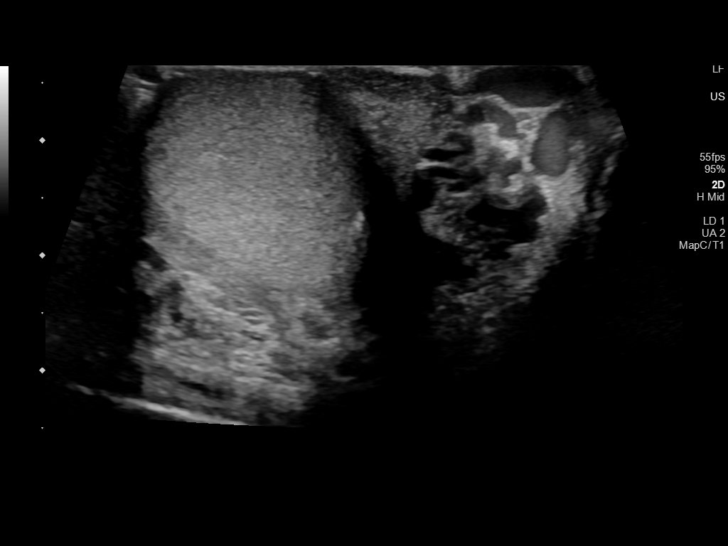
[im 26/32]
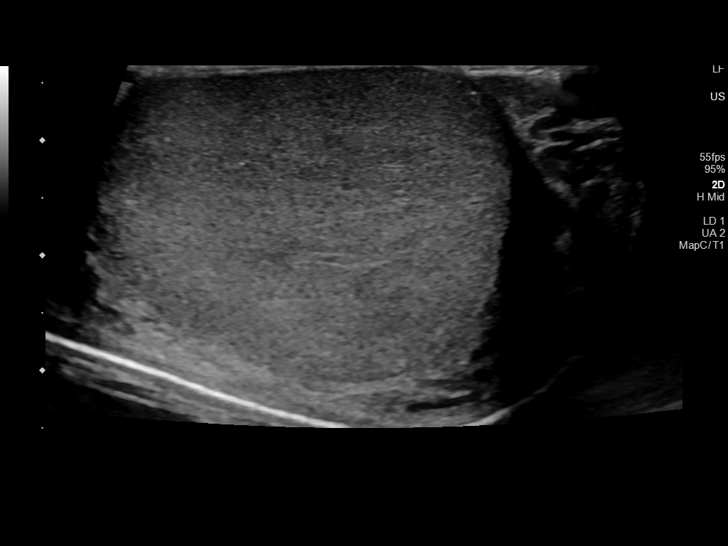
[im 29/32]
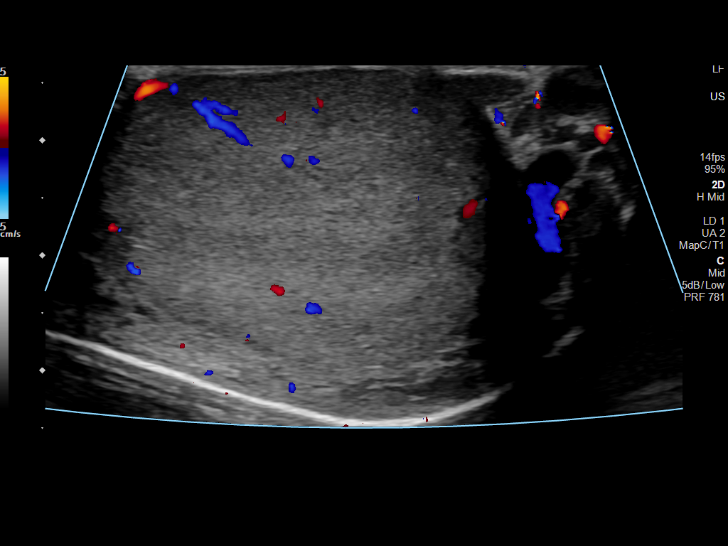
[im 32/32]
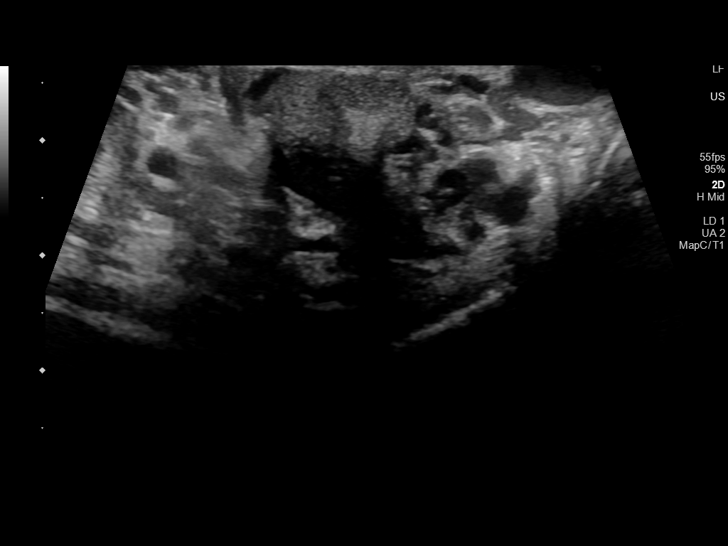

[14 of 25 positions shown; findings below may reference images not displayed]

FINDINGS: Right testicle

Measurements: 5.3 x 3.7 x 2.2 cm. No mass or microlithiasis
visualized.

Left testicle

Measurements: 5.0 x 3.7 x 2.8 cm. No mass or microlithiasis
visualized.

Right epididymis:  Normal in size and appearance.

Left epididymis:  Normal in size and appearance.

Hydrocele:  None visualized.

Varicocele:  None visualized.

Pulsed Doppler interrogation of both testes demonstrates normal low
resistance arterial and venous waveforms bilaterally.
IMPRESSION: Normal study. No evidence of testicular mass or torsion.

## 2022-03-23 IMAGING — CR DG ABDOMEN 2V
2 series · 2 of 2 positions shown · non-contrast
Comparison: None.

CLINICAL DATA: Lower abdominal pain. Frequent bowel movements and
loose stools.

EXAM:
ABDOMEN - 2 VIEW

[t abdomen supine]
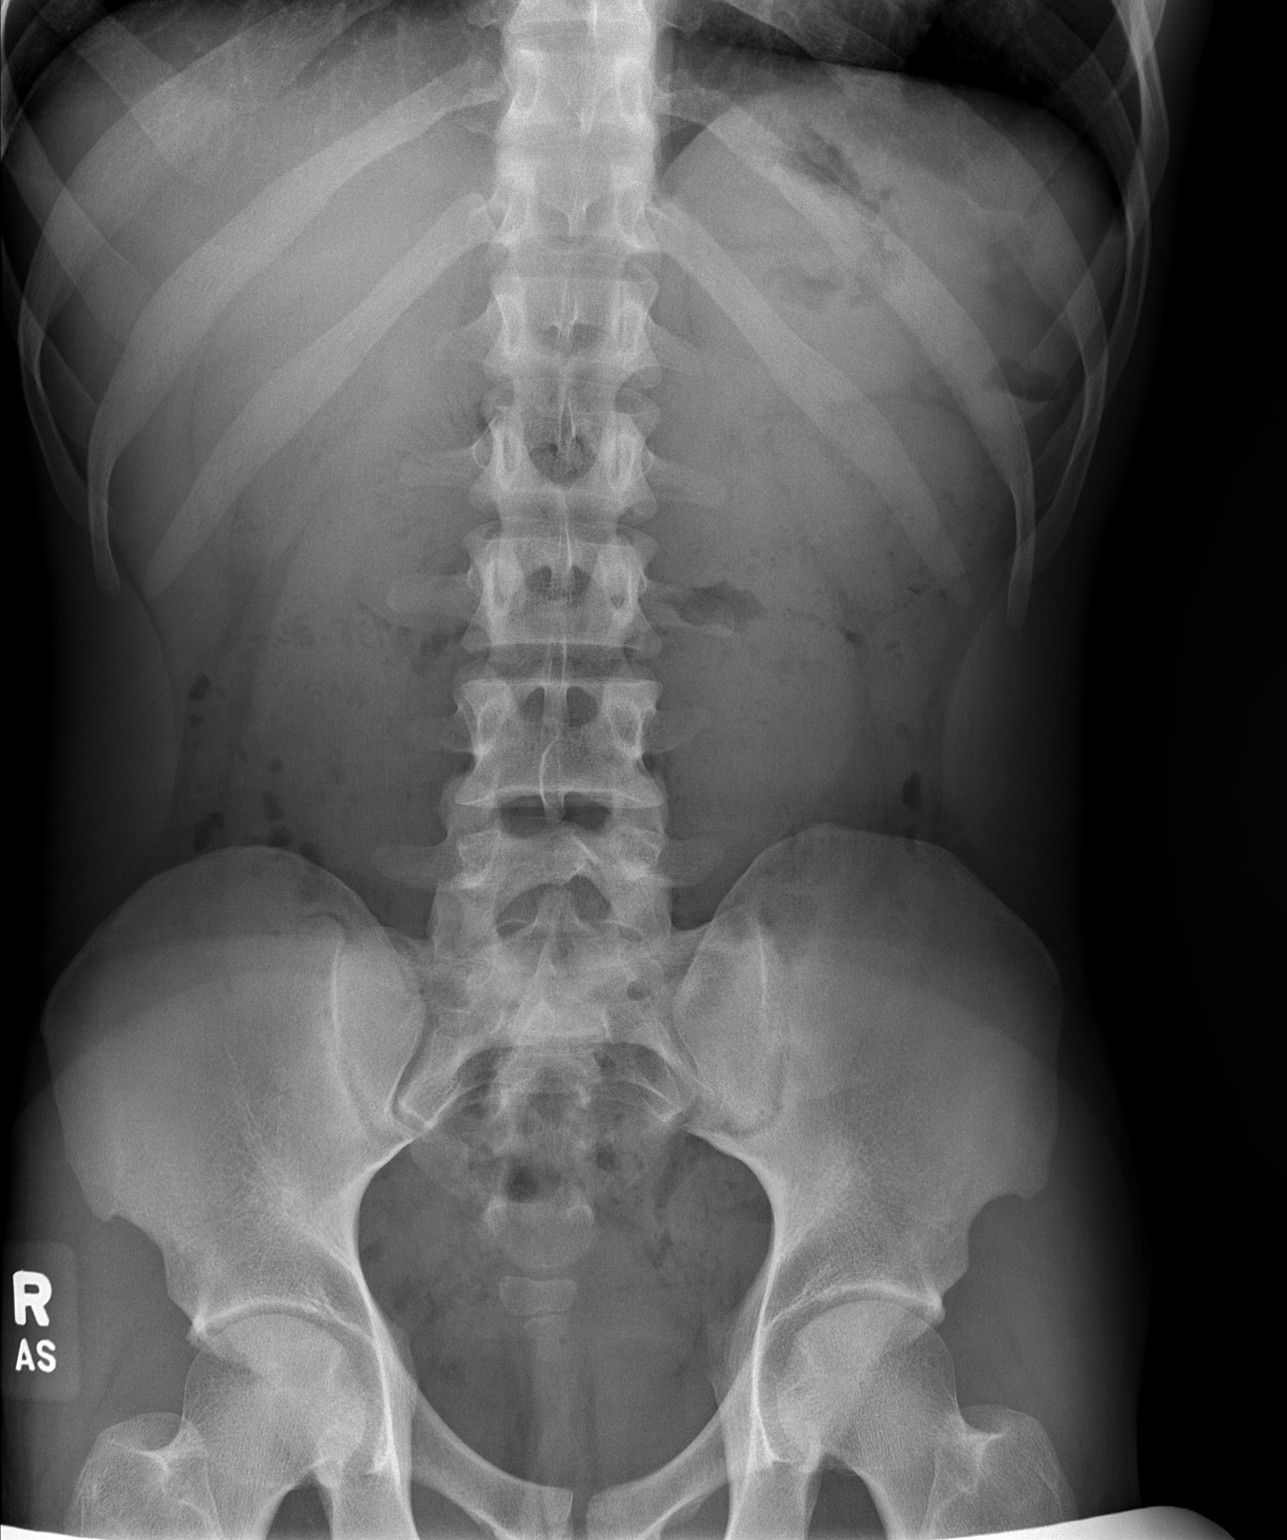

[w abdomen upright]
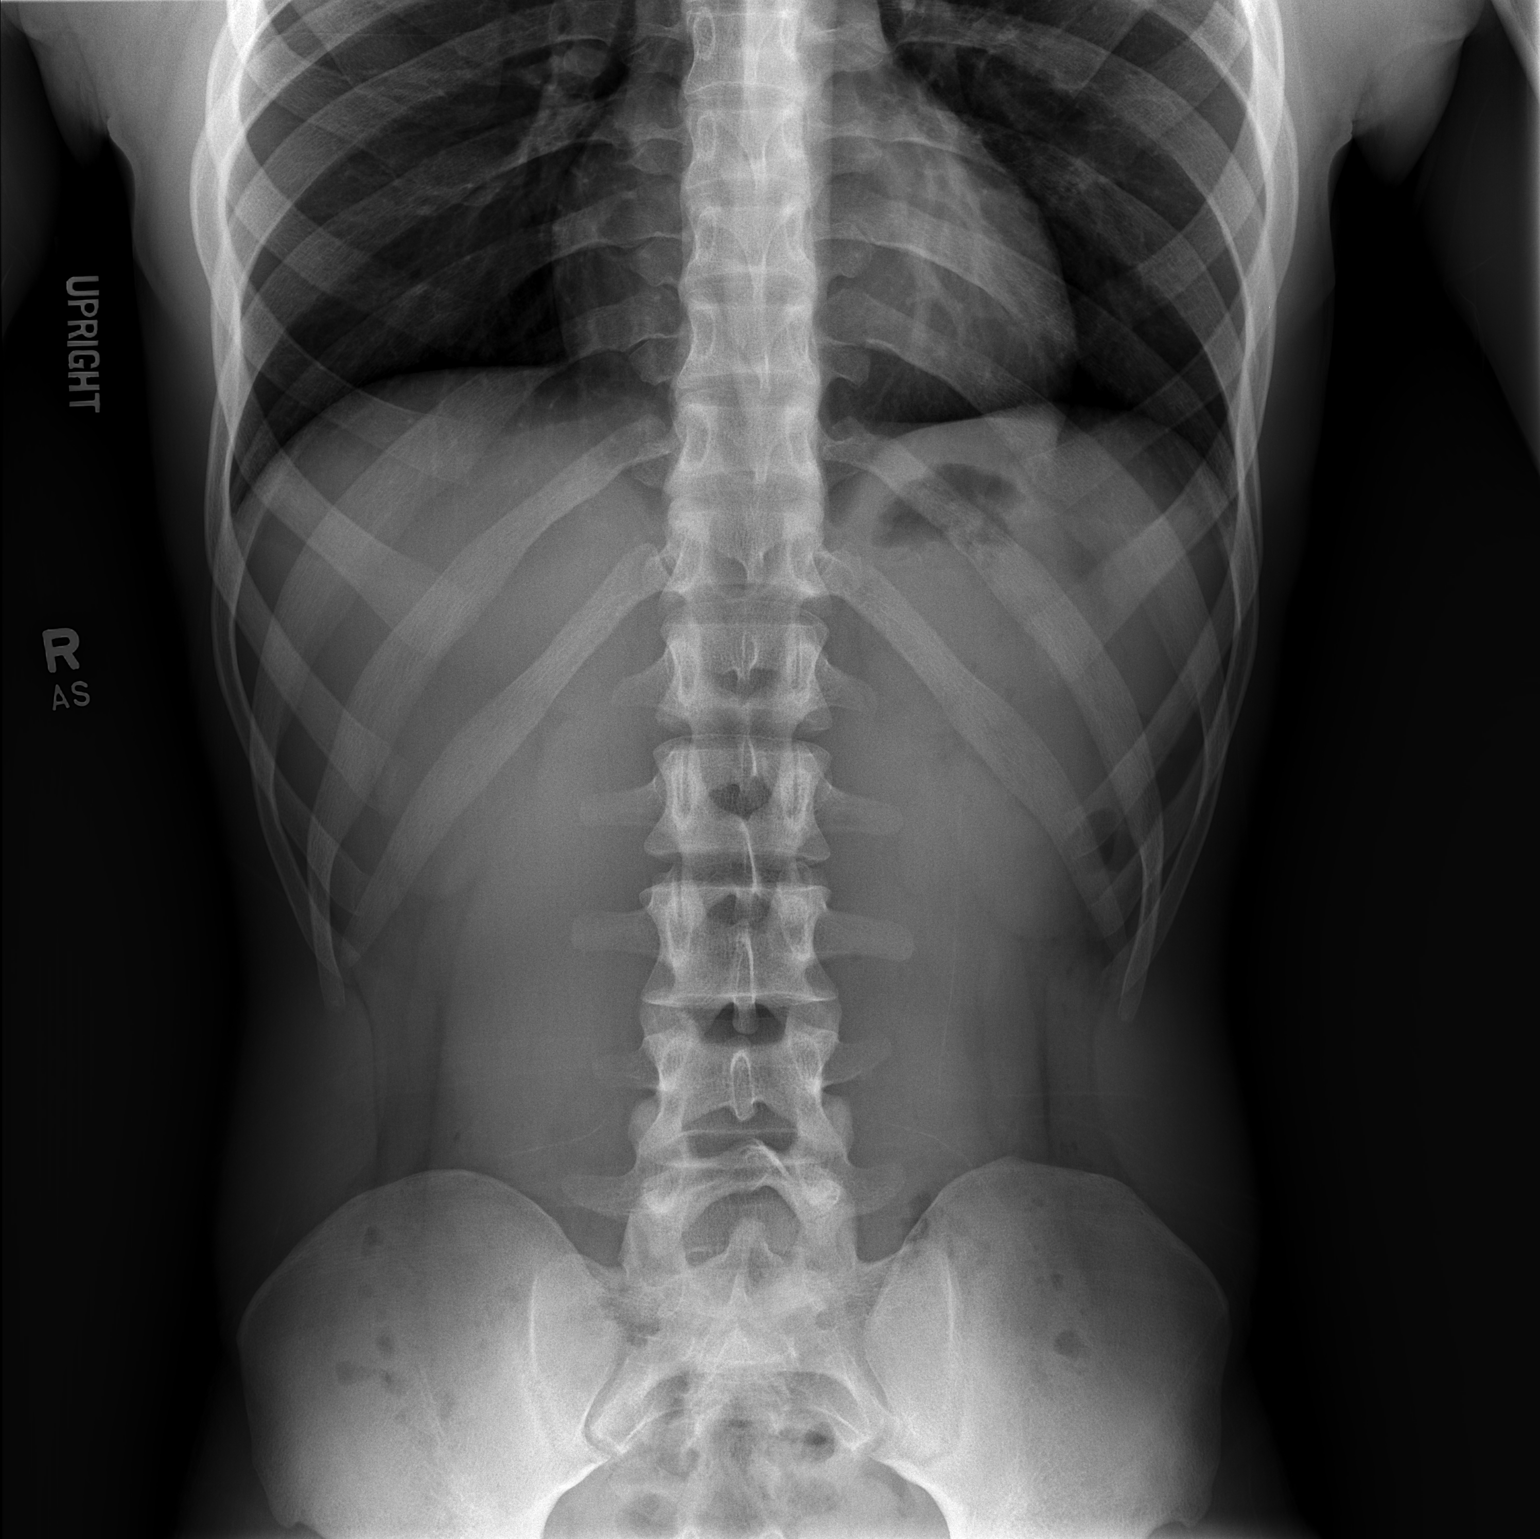

[2 of 2 positions shown; findings below may reference images not displayed]

FINDINGS: The bowel gas pattern is normal. There is no evidence of free air.
No radio-opaque calculi or other significant radiographic
abnormality is seen.
IMPRESSION: Normal abdominal radiographs.
